# Patient Record
Sex: Male | Born: 1980 | Race: White | Hispanic: No | Marital: Single | Smoking: Current every day smoker
Health system: Southern US, Community
[De-identification: ages and names within clinical notes are randomized; demographics above are authoritative.]

## PROBLEM LIST (undated history)

## (undated) DIAGNOSIS — H9190 Unspecified hearing loss, unspecified ear: Secondary | ICD-10-CM

## (undated) DIAGNOSIS — N289 Disorder of kidney and ureter, unspecified: Secondary | ICD-10-CM

## (undated) HISTORY — PX: TONSILLECTOMY: SUR1361

## (undated) HISTORY — PX: NASAL SINUS SURGERY: SHX719

## (undated) HISTORY — PX: ADENOIDECTOMY: SUR15

---

## 2013-10-06 ENCOUNTER — Emergency Department: Payer: Self-pay | Admitting: Emergency Medicine

## 2013-11-28 DIAGNOSIS — Y9289 Other specified places as the place of occurrence of the external cause: Secondary | ICD-10-CM | POA: Insufficient documentation

## 2013-11-28 DIAGNOSIS — S8000XA Contusion of unspecified knee, initial encounter: Secondary | ICD-10-CM | POA: Insufficient documentation

## 2013-11-28 DIAGNOSIS — S99929A Unspecified injury of unspecified foot, initial encounter: Secondary | ICD-10-CM

## 2013-11-28 DIAGNOSIS — W108XXA Fall (on) (from) other stairs and steps, initial encounter: Secondary | ICD-10-CM | POA: Insufficient documentation

## 2013-11-28 DIAGNOSIS — Y9389 Activity, other specified: Secondary | ICD-10-CM | POA: Diagnosis not present

## 2013-11-28 DIAGNOSIS — S8990XA Unspecified injury of unspecified lower leg, initial encounter: Secondary | ICD-10-CM | POA: Diagnosis present

## 2013-11-28 DIAGNOSIS — Z87891 Personal history of nicotine dependence: Secondary | ICD-10-CM | POA: Diagnosis not present

## 2013-11-28 DIAGNOSIS — S99919A Unspecified injury of unspecified ankle, initial encounter: Secondary | ICD-10-CM

## 2013-11-28 NOTE — ED Notes (Signed)
Pt states he was going up stairs and he fell landing on his right knee. Pt states he felt something snap. Pt states tried Ibuprofen with no relief.

## 2013-11-29 ENCOUNTER — Emergency Department (HOSPITAL_COMMUNITY): Payer: Medicaid Other

## 2013-11-29 ENCOUNTER — Encounter (HOSPITAL_COMMUNITY): Payer: Self-pay | Admitting: Emergency Medicine

## 2013-11-29 ENCOUNTER — Emergency Department (HOSPITAL_COMMUNITY)
Admission: EM | Admit: 2013-11-29 | Discharge: 2013-11-29 | Disposition: A | Discharge status: Home / Self Care | Payer: Medicaid Other | Attending: Emergency Medicine | Admitting: Emergency Medicine

## 2013-11-29 DIAGNOSIS — S8001XA Contusion of right knee, initial encounter: Secondary | ICD-10-CM

## 2013-11-29 MED ORDER — HYDROCODONE-ACETAMINOPHEN 5-325 MG PO TABS
2.0000 | ORAL_TABLET | ORAL | Status: AC | PRN
Start: 2013-11-29 — End: ?

## 2013-11-29 MED ORDER — KETOROLAC TROMETHAMINE 60 MG/2ML IM SOLN
60.0000 mg | Freq: Once | INTRAMUSCULAR | Status: AC
  Administered 2013-11-29: 60 mg via INTRAMUSCULAR
  Filled 2013-11-29: qty 2

## 2013-11-29 MED ORDER — NAPROXEN 500 MG PO TABS: 500.0000 mg | ORAL_TABLET | Freq: Two times a day (BID) | ORAL | Status: DC

## 2013-11-29 NOTE — ED Provider Notes (Signed)
CSN: [id number]     Arrival date & time 11/28/13  2356 History   First MD Initiated Contact with Patient 11/29/13 0148     Chief Complaint  Patient presents with  . Fall  . Knee Pain     (Consider location/radiation/quality/duration/timing/severity/associated sxs/prior Treatment) HPI Comments: 33 year old male, no significant past medical history presents after a fall where he injured his right knee, this occurred just prior to arrival, fell on his right knee with a direct impact over the patella. There persisted, worse with ambulation and associated with mild swelling anteriorly. He denies history of fractures.  Patient is a 33 y.o. male presenting with fall and knee pain. The history is provided by the patient.  Fall  Knee Pain   History reviewed. No pertinent past medical history. Past Surgical History  Procedure Laterality Date  . Tonsillectomy     History reviewed. No pertinent family history. History  Substance Use Topics  . Smoking status: Former Research scientist (life sciences)  . Smokeless tobacco: Not on file  . Alcohol Use: No    Review of Systems  Musculoskeletal: Positive for gait problem.  Skin: Positive for color change.      Allergies  Levofloxacin and Tramadol  Home Medications   Prior to Admission medications   Medication Sig Start Date End Date Taking? Authorizing Provider  HYDROcodone-acetaminophen (NORCO/VICODIN) 5-325 MG per tablet Take 2 tablets by mouth every 4 (four) hours as needed. 11/29/13   Johnna Acosta, MD  naproxen (NAPROSYN) 500 MG tablet Take 1 tablet (500 mg total) by mouth 2 (two) times daily with a meal. 11/29/13   Johnna Acosta, MD   BP 144/83  Pulse 92  Temp(Src) 97.7 F (36.5 C) (Oral)  Resp 18  SpO2 99% Physical Exam  Nursing note and vitals reviewed. Constitutional: He appears well-developed and well-nourished. No distress.  HENT:  Head: Normocephalic and atraumatic.  Eyes: Conjunctivae are normal. No scleral icterus.  Cardiovascular: Normal  rate, regular rhythm and intact distal pulses.   Pulmonary/Chest: Effort normal and breath sounds normal.  Musculoskeletal: He exhibits tenderness ( Tenderness in the distal quadriceps, patellar tendon and mostly over the patella with associated small hematoma. He is able to straight leg raise. Decreased range of motion secondary to pain). He exhibits no edema.  Neurological: He is alert.  Skin: Skin is warm and dry. No rash noted. He is not diaphoretic.    ED Course  Procedures (including critical care time) Labs Review Labs Reviewed - No data to display  Imaging Review Dg Knee Complete 4 Views Right  11/29/2013   CLINICAL DATA:  Fall.  EXAM: RIGHT KNEE - COMPLETE 4+ VIEW  COMPARISON:  10/06/2013 A  FINDINGS: There is no evidence of fracture, dislocation, or joint effusion. There is no evidence of arthropathy or other focal bone abnormality. Soft tissues are unremarkable.  IMPRESSION: Negative.   Electronically Signed   By: Franchot Gallo M.D.   On: 11/29/2013 01:14      MDM   Final diagnoses:  Contusion of right knee, initial encounter    Extensor mechanism is intact, x-rays negative for fracture, hematoma, rice therapy, patient informed of plan and agreeable   Meds given in ED:  Medications  ketorolac (TORADOL) injection 60 mg (not administered)    New Prescriptions   HYDROCODONE-ACETAMINOPHEN (NORCO/VICODIN) 5-325 MG PER TABLET    Take 2 tablets by mouth every 4 (four) hours as needed.   NAPROXEN (NAPROSYN) 500 MG TABLET    Take 1 tablet (500  mg total) by mouth 2 (two) times daily with a meal.        Johnna Acosta, MD 11/29/13 0200

## 2013-11-29 NOTE — ED Notes (Signed)
Discharge instructions reviewed with pt. Pt verbalized understanding.   

## 2013-11-29 NOTE — ED Notes (Signed)
Pt was carrying a load of laundry up the stairs when he fell backwards and hit his knee on the concrete. Pt states he felt a pop. Pt took ibuprofen 800mg  at 10pm and 12am with no relief. Pt rates pain 7-8/10. Redness and swelling noted to knee. Pt states his knee pops every time he bends it.

## 2013-11-29 NOTE — Discharge Instructions (Signed)
Xray normal, follow up with family doctor Wear the knee immobilizer for 2-3 days  RICE therapy  Emergency Department Resource Guide 1) Find a Doctor and Pay Out of Pocket Although you won't have to find out who is covered by your insurance plan, it is a good idea to ask around and get recommendations. You will then need to call the office and see if the doctor you have chosen will accept you as a new patient and what types of options they offer for patients who are self-pay. Some doctors offer discounts or will set up payment plans for their patients who do not have insurance, but you will need to ask so you aren't surprised when you get to your appointment.  2) Contact Your Local Health Department Not all health departments have doctors that can see patients for sick visits, but many do, so it is worth a call to see if yours does. If you don't know where your local health department is, you can check in your phone book. The CDC also has a tool to help you locate your state's health department, and many state websites also have listings of all of their local health departments.  3) Find a [facility] If your illness is not likely to be very severe or complicated, you may want to try a walk in clinic. These are popping up all over the country in pharmacies, drugstores, and shopping centers. They're usually staffed by nurse practitioners or physician assistants that have been trained to treat common illnesses and complaints. They're usually fairly quick and inexpensive. However, if you have serious medical issues or chronic medical problems, these are probably not your best option.  No Primary Care Doctor: - Call Health Connect at  [phone] - they can help you locate a primary care doctor that  accepts your insurance, provides certain services, etc. - Physician Referral Service- [phone]  Chronic Pain Problems: Organization         Address  Phone   Notes  [facility]   [phone] Patients need to be referred by their primary care doctor.   Medication Assistance: Organization         Address  Phone   Notes  [facility] Medication [facility] [address] [place], [place] [address] [phone] --Must be a resident of [facility] -- Must have NO insurance coverage whatsoever (no Medicaid/ Medicare, etc.) -- The pt. MUST have a primary care doctor that directs their care regularly and follows them in the community   MedAssist  [phone]   [employer]  [phone]    Agencies that provide inexpensive medical care: Organization         Address  Phone   Notes  [place]  [phone]   [facility]    [phone]   [facility] [place], [place] [address] [phone]   [place] [address], [place] [phone]   Planned Parenthood    7177204785   [facility]    [phone]   [place] and [place] [address], [place] Phone:  [phone], Fax:  [phone] Hours of Operation:  9 am - 6 pm, M-F.  Also accepts Medicaid/Medicare and self-pay.  [facility] for [place] [address], [address], [place] Phone: [phone], Fax: [phone]. Hours of Operation:  8:30 am - 5:30 pm, M-F.  Also accepts Medicaid and self-pay.  [facility] [place] [address] [place] Phone: [phone]   [place], [place], [place] [phone], Ext. [phone] Mondays & Thursdays: 7-9 AM.  First 15 patients are seen on a first come, first serve basis.    [place] Providers:  Organization         Address  Phone   Notes  [facility] [address], [place] [phone] Also accepts self-pay patients.  [facility] [address] [place], [place]  [phone]   [place], [address], [place] [phone]   [facility] [address], [place] [phone]   Lucianne Lei [address], [place]   [phone] Only accepts [place] Access [place] patients after they have their name applied to their card.   Self-Pay (no insurance) in [facility]:  Organization         Address  Phone   Notes  Sickle Cell Patients, [facility] [place] [phone]   [facility]gent Care [place] [phone]   [facility] [place]  [place], [place], [employer] [phone]   [facility]/Dr. Osei-Bonsu  [address] [place] or [address], [place] [phone] Phone number for both [place] and [place] locations is the same.  Urgent Medical and [facility] [address], [place] [phone]   [facility] [address] [place] or [address] [phone]   [facility] [address] [place] [phone], phone; [phone], fax Sees patients 1st and 3rd Saturday of every month.  Must not qualify for public or private insurance (i.e. Medicaid, Medicare, [place] Health Choice, Veterans' Benefits)  Household income should be no more than 200% of the poverty level The clinic cannot treat you if you are pregnant or think you are pregnant  Sexually transmitted diseases are not treated at the clinic.    Dental Care: Organization         Address  Phone  Notes  [facility] Department of [facility] [place] [phone] Accepts children up to age 10 who are enrolled in [place] or [place]; pregnant women with a Medicaid card; and children who have applied for Medicaid or [place] Health  Choice, but were declined, whose parents can pay a reduced fee at time of service.  [facility] Department of [facility]  [address], [place] [phone] Accepts children up to age 32 who are enrolled in [place] or [place]; pregnant women with a Medicaid card; and children who have applied for Medicaid or [place] Health Choice, but were declined, whose parents can pay a reduced fee at time of service.  [place] Adult Dental Access PROGRAM  [place] [phone] Patients are seen by appointment only. Walk-ins are not accepted. [place] will see patients 78 years of age and older. Monday - Tuesday (8am-5pm) Most Wednesdays (8:30-5pm) $30 per visit, cash only  [facility] Adult Dental Access PROGRAM  [address], [facility] [phone] Patients are seen by appointment  only. Walk-ins are not accepted. [place] will see patients 69 years of age and older. One Wednesday Evening (Monthly: Volunteer Based).  $30 per visit, cash only  [place]  [phone] for adults; Children under age 5, call Graduate Pediatric Dentistry at [phone]. Children aged 58-14, please call [phone] to request a pediatric application.  Dental services are provided in all areas of dental care including fillings, crowns and bridges, complete and partial dentures, implants, gum treatment, root canals, and extractions. Preventive care is also provided. Treatment is provided to both adults and children. Patients are selected via a lottery and there is often a waiting list.   [facility] [address], [place]  [phone] [email or web address]   [facility] [address] [place], [place] [phone], Ext. [phone] Second and Fourth Thursday of each month, opens at 6:30 AM; Clinic ends at 9 AM.  Patients are seen on a first-come first-served basis, and a limited number are seen during each  clinic.   [facility]  [address] Hillard Danker [place], [place] [phone]   Eligibility Requirements You must have lived in [place], [place], or [place] counties for at least the last three months.   You cannot be eligible for state or federal sponsored [employer], including [employer], [place], or [employer].   You generally cannot be eligible for healthcare insurance through your employer.    How to apply: Eligibility screenings are held every Tuesday and Wednesday afternoon from 1:00 pm until 4:00 pm. You do not need an appointment for the interview!  [facility] [address] [place], [place]   [place]  [place] Department  [place]  [phone]    Behavioral Health Resources in the Community: Intensive Outpatient Programs Organization         Address  Phone  Notes  [place]. [address] [place], [place] [phone]   [facility] Outpatient [address], [place], [place]   ADS: Alcohol & Drug Svcs [address] [place], [place]   [place] [address]  [place], [place] or [phone]   Substance Abuse Resources Organization         Address  Phone  Notes  Alcohol and Drug Services  [phone]   [place]  8025057661   [facility] [place]   Chinita Pester  [phone]   Residential & Outpatient Substance Abuse Program  [phone]   Psychological Services Organization         Address  Phone  Notes  [facility] [place]  [place]  [phone]   [place] [address] [place] or [phone]    Mobile Crisis Teams Organization         Address  Phone  Notes  [facility]rapeutic Alternatives, Mobile  Crisis Care Unit  [phone]   Assertive Psychotherapeutic Services  [address] [place], [place]   [place] [address] [place] [phone]    Self-Help/Support Groups Organization         Address  Phone             Notes  [place]. of  - variety of support groups  [place] Call for more information  Narcotics Anonymous (NA), [facility]aring [facility] [address], [employer] [place]  2 meetings at this location   Special educational needs teacher         Address  Phone  Notes  [facility]AP Residential Treatment [place],    [place]  [phone]   [facility]  [address] [place] [address], [place], [place]   [place], [place] [phone] Admissions: 8am-3pm M-F  Incentives Substance [place] [address]    [place], [place] [phone]   [facility] [address] [place], [place], [place]   [facility] [address],  [place], [place]   [facility] Programs - Intensive Outpatient [address], [place], [place]   [facility] ([place].,  [place], [place] [phone] or [phone]   [facility] Services (RTS) [address] [place], [place] Accepts Medicaid  [place] [address],  [place] [phone] Substance Abuse/Addiction Treatment   [facility] Organization         Address  Phone  Notes  [facility]  [phone]   Domenic Schwab, PhD [address] Arlis Porta [place], [place]   [phone] or [phone]   [place], [place] [phone]   [facility] [address], [place], [place] [phone] Insurance/Medicaid/sponsorship through [facility] and Families [address], [address]                                    [place], [place] [phone] [place] [address], [place] [phone]    Dr. Adele Schilder  [phone]   [facility] Dept. 1) [address], [place] 2) [place] 3)  [place] [address], [place] [phone]  [phone]   [place] [phone] or [phone] (After Hours)

## 2013-12-18 ENCOUNTER — Encounter (HOSPITAL_COMMUNITY): Payer: Self-pay | Admitting: Emergency Medicine

## 2013-12-18 ENCOUNTER — Emergency Department (HOSPITAL_COMMUNITY)
Admission: EM | Admit: 2013-12-18 | Discharge: 2013-12-18 | Disposition: A | Discharge status: Home / Self Care | Payer: Medicaid Other | Attending: Family Medicine | Admitting: Family Medicine

## 2013-12-18 DIAGNOSIS — Z791 Long term (current) use of non-steroidal anti-inflammatories (NSAID): Secondary | ICD-10-CM | POA: Diagnosis not present

## 2013-12-18 DIAGNOSIS — H7290 Unspecified perforation of tympanic membrane, unspecified ear: Secondary | ICD-10-CM | POA: Diagnosis not present

## 2013-12-18 DIAGNOSIS — H9210 Otorrhea, unspecified ear: Secondary | ICD-10-CM | POA: Diagnosis present

## 2013-12-18 DIAGNOSIS — Z87891 Personal history of nicotine dependence: Secondary | ICD-10-CM | POA: Insufficient documentation

## 2013-12-18 DIAGNOSIS — H7291 Unspecified perforation of tympanic membrane, right ear: Secondary | ICD-10-CM

## 2013-12-18 DIAGNOSIS — H9190 Unspecified hearing loss, unspecified ear: Secondary | ICD-10-CM | POA: Diagnosis not present

## 2013-12-18 HISTORY — DX: Unspecified hearing loss, unspecified ear: H91.90

## 2013-12-18 MED ORDER — CIPROFLOXACIN-HYDROCORTISONE 0.2-1 % OT SUSP: 4.0000 [drp] | Freq: Two times a day (BID) | OTIC | Status: AC

## 2013-12-18 MED ORDER — AMOXICILLIN-POT CLAVULANATE 875-125 MG PO TABS: 1.0000 | ORAL_TABLET | Freq: Two times a day (BID) | ORAL | Status: DC

## 2013-12-18 NOTE — ED Notes (Signed)
Declined W/C at D/C and was escorted to lobby by RN. 

## 2013-12-18 NOTE — ED Provider Notes (Signed)
CSN: [id number]     Arrival date & time 12/18/13  1400 History   First MD Initiated Contact with Patient 12/18/13 1420     Chief Complaint  Patient presents with  . Ear Drainage     (Consider location/radiation/quality/duration/timing/severity/associated sxs/prior Treatment) Patient is a 33 y.o. male presenting with ear drainage. The history is provided by the patient.  Ear Drainage This is a new problem. The current episode started more than 2 days ago (onset of pain and drainage of right ear 4 d ago after swimming,h/o tubes in ears.). The problem has not changed since onset.Associated symptoms comments: C/o pain..    Past Medical History  Diagnosis Date  . Problems with hearing    Past Surgical History  Procedure Laterality Date  . Tonsillectomy     No family history on file. History  Substance Use Topics  . Smoking status: Former Research scientist (life sciences)  . Smokeless tobacco: Not on file  . Alcohol Use: No    Review of Systems  Constitutional: Negative.   HENT: Positive for ear discharge, ear pain and hearing loss.       Allergies  Levofloxacin and Tramadol  Home Medications   Prior to Admission medications   Medication Sig Start Date End Date Taking? Authorizing Provider  HYDROcodone-acetaminophen (NORCO/VICODIN) 5-325 MG per tablet Take 2 tablets by mouth every 4 (four) hours as needed. 11/29/13  Yes Johnna Acosta, MD  naproxen (NAPROSYN) 500 MG tablet Take 1 tablet (500 mg total) by mouth 2 (two) times daily with a meal. 11/29/13  Yes Johnna Acosta, MD  amoxicillin-clavulanate (AUGMENTIN) 875-125 MG per tablet Take 1 tablet by mouth 2 (two) times daily. 12/18/13   Billy Fischer, MD  ciprofloxacin-hydrocortisone (CIPRO [facility]) otic suspension Place 4 drops into both ears 2 (two) times daily. 12/18/13   Billy Fischer, MD   BP 136/92  Pulse 76  Temp(Src) 97.9 F (36.6 C) (Oral)  Resp 15  Wt 200 lb (90.719 kg)  SpO2 99% Physical Exam  Nursing note and vitals  reviewed. Constitutional: He appears well-developed and well-nourished.  HENT:  Head: Normocephalic.  Right Ear: Ear canal normal. Tympanic membrane is scarred and retracted. Tympanic membrane is not erythematous. Tympanic membrane mobility is abnormal.  Left Ear: Ear canal normal. Tympanic membrane is not erythematous. Tympanic membrane mobility is normal.  Ears:  Mouth/Throat: Oropharynx is clear and moist.    ED Course  Procedures (including critical care time) Labs Review Labs Reviewed - No data to display  Imaging Review No results found.   EKG Interpretation None      MDM   Final diagnoses:  Perforated tympanic membrane on examination, right       Billy Fischer, MD 12/18/13 [phone]

## 2013-12-18 NOTE — Discharge Instructions (Signed)
Use medicine as prescribed and see dr Erik Obey for ear check this week.

## 2013-12-18 NOTE — ED Notes (Signed)
Pt reports went swimming days ago, felt a pop in his ears, since then right ear drainage. Reports he has tubes in his ears for his whole life. Reports hearing is decreased

## 2015-05-02 ENCOUNTER — Emergency Department
Admission: EM | Admit: 2015-05-02 | Discharge: 2015-05-02 | Disposition: A | Discharge status: Home / Self Care | Payer: Medicaid Other | Attending: Emergency Medicine | Admitting: Emergency Medicine

## 2015-05-02 ENCOUNTER — Encounter: Payer: Self-pay | Admitting: Emergency Medicine

## 2015-05-02 DIAGNOSIS — Z87891 Personal history of nicotine dependence: Secondary | ICD-10-CM | POA: Insufficient documentation

## 2015-05-02 DIAGNOSIS — Z792 Long term (current) use of antibiotics: Secondary | ICD-10-CM | POA: Insufficient documentation

## 2015-05-02 DIAGNOSIS — Z791 Long term (current) use of non-steroidal anti-inflammatories (NSAID): Secondary | ICD-10-CM | POA: Insufficient documentation

## 2015-05-02 DIAGNOSIS — L089 Local infection of the skin and subcutaneous tissue, unspecified: Secondary | ICD-10-CM

## 2015-05-02 DIAGNOSIS — L723 Sebaceous cyst: Secondary | ICD-10-CM | POA: Insufficient documentation

## 2015-05-02 MED ORDER — IBUPROFEN 800 MG PO TABS: 800.0000 mg | ORAL_TABLET | Freq: Three times a day (TID) | ORAL | Status: DC | PRN

## 2015-05-02 MED ORDER — LIDOCAINE-EPINEPHRINE 1 %-1:100000 IJ SOLN
30.0000 mL | Freq: Once | INTRAMUSCULAR | Status: DC
  Filled 2015-05-02: qty 30

## 2015-05-02 MED ORDER — SULFAMETHOXAZOLE-TRIMETHOPRIM 800-160 MG PO TABS: 1.0000 | ORAL_TABLET | Freq: Two times a day (BID) | ORAL | Status: DC

## 2015-05-02 MED ORDER — OXYCODONE-ACETAMINOPHEN 5-325 MG PO TABS: 1.0000 | ORAL_TABLET | Freq: Four times a day (QID) | ORAL | Status: DC | PRN

## 2015-05-02 MED ORDER — LIDOCAINE HCL (PF) 1 % IJ SOLN
5.0000 mL | Freq: Once | INTRAMUSCULAR | Status: AC
  Administered 2015-05-02: 5 mL
  Filled 2015-05-02: qty 5

## 2015-05-02 MED ORDER — SULFAMETHOXAZOLE-TRIMETHOPRIM 800-160 MG PO TABS
1.0000 | ORAL_TABLET | Freq: Once | ORAL | Status: AC
  Administered 2015-05-02: 1 via ORAL
  Filled 2015-05-02: qty 1

## 2015-05-02 MED ORDER — OXYCODONE-ACETAMINOPHEN 5-325 MG PO TABS
1.0000 | ORAL_TABLET | Freq: Once | ORAL | Status: AC
  Administered 2015-05-02: 1 via ORAL
  Filled 2015-05-02: qty 1

## 2015-05-02 NOTE — ED Notes (Addendum)
Red raised area with black center to left upper abdomen. Pt reports drainage x 2 days.

## 2015-05-02 NOTE — ED Notes (Signed)
Pt discharged to home.  Friend driving.  Discharge instructions reviewed.  Verbalized understanding.  No questions or concerns at this time.  Teach back verified.  Pt in NAD.  No items left in ED.   

## 2015-05-02 NOTE — ED Provider Notes (Signed)
[facility] Emergency Department Provider Note  ____________________________________________  Time seen: Approximately 9:48 PM  I have reviewed the triage vital signs and the nursing notes.   HISTORY  Chief Complaint Abscess    HPI Eric Carrillo is a 35 y.o. male with approximately 2 week history of increasing soreness, redness to the left anterior chest. Area started to drain yesterday. There is become more sore. No fevers or chills.   Past Medical History  Diagnosis Date  . Problems with hearing     There are no active problems to display for this patient.   Past Surgical History  Procedure Laterality Date  . Tonsillectomy    . Adenoidectomy    . Nasal sinus surgery      Current Outpatient Rx  Name  Route  Sig  Dispense  Refill  . amoxicillin-clavulanate (AUGMENTIN) 875-125 MG per tablet   Oral   Take 1 tablet by mouth 2 (two) times daily.   20 tablet   0   . ciprofloxacin-hydrocortisone (CIPRO HC) otic suspension   Both Ears   Place 4 drops into both ears 2 (two) times daily.   10 mL   0   . HYDROcodone-acetaminophen (NORCO/VICODIN) 5-325 MG per tablet   Oral   Take 2 tablets by mouth every 4 (four) hours as needed.   10 tablet   0   . ibuprofen (ADVIL,MOTRIN) 800 MG tablet   Oral   Take 1 tablet (800 mg total) by mouth every 8 (eight) hours as needed.   15 tablet   0   . naproxen (NAPROSYN) 500 MG tablet   Oral   Take 1 tablet (500 mg total) by mouth 2 (two) times daily with a meal.   30 tablet   0   . oxyCODONE-acetaminophen (ROXICET) 5-325 MG tablet   Oral   Take 1 tablet by mouth every 6 (six) hours as needed.   20 tablet   0   . sulfamethoxazole-trimethoprim (BACTRIM DS,SEPTRA DS) 800-160 MG tablet   Oral   Take 1 tablet by mouth 2 (two) times daily.   14 tablet   0     Allergies Levofloxacin and Tramadol  History reviewed. No pertinent family history.  Social History Social History  Substance Use  Topics  . Smoking status: Former Research scientist (life sciences)  . Smokeless tobacco: None  . Alcohol Use: No    Review of Systems Constitutional: No fever/chills Eyes: No visual changes. ENT: No sore throat. Cardiovascular: Denies chest pain. Respiratory: Denies shortness of breath. Gastrointestinal: No abdominal pain.  No nausea, no vomiting.  No diarrhea.  No constipation. Genitourinary: Negative for dysuria. Musculoskeletal: Negative for back pain. Skin: Negative for rash, but has infected sebaceous cyst to left chest wall. Neurological: Negative for headaches, focal weakness or numbness. 10-point ROS otherwise negative.  ____________________________________________   PHYSICAL EXAM:  VITAL SIGNS: ED Triage Vitals  Enc Vitals Group     BP 05/02/15 2120 152/93 mmHg     Pulse Rate 05/02/15 2120 90     Resp 05/02/15 2120 18     Temp 05/02/15 2120 98.1 F (36.7 C)     Temp Source 05/02/15 2120 Oral     SpO2 05/02/15 2120 99 %     Weight 05/02/15 2120 215 lb (97.523 kg)     Height 05/02/15 2120 5\' 8"  (1.727 m)     Head Cir --      Peak Flow --      Pain Score 05/02/15 2121  8     Pain Loc --      Pain Edu? --      Excl. in [place]? --     Constitutional: Alert and oriented. Well appearing and in no acute distress. Eyes: Conjunctivae are normal.  Mouth/Throat: Mucous membranes are moist. Neck:  Supple.   Cardiovascular: Normal rate, regular rhythm. Grossly normal heart sounds.  Good peripheral circulation. Respiratory: Normal respiratory effort.  No retractions. Lungs CTAB. Musculoskeletal: Nml ROM of upper and lower extremity joints. Neurologic:  Normal speech and language. No gross focal neurologic deficits are appreciated. No gait instability. Skin:  Skin is warm, dry and intact. No rash noted. 3 cm indurated area to the left chest wall with surrounding erythema small amount of purulent drainage noted. Psychiatric: Mood and affect are normal. Speech and behavior are  normal.  ____________________________________________   LABS (all labs ordered are listed, but only abnormal results are displayed)  Labs Reviewed - No data to display ____________________________________________  EKG   ____________________________________________  RADIOLOGY   ____________________________________________   PROCEDURES  Procedure(s) performed: Area cleansed with chlorhexidine. Anesthesia with lidocaine 1%. Incision with 11 blade. Small amount of purulent drainage, cyst material removed. Packing gauze used. Patient instructed in wound care.  Critical Care performed: No  ____________________________________________   INITIAL IMPRESSION / ASSESSMENT AND PLAN / ED COURSE  Pertinent labs & imaging results that were available during my care of the patient were reviewed by me and considered in my medical decision making (see chart for details).  35 year old male with infected cyst to the left chest wall. Incision and drainage performed as per procedure above. Started on Bactrim and given Percocet for pain control with ibuprofen. Given handouts on possible MRSA. He will return for any worsening symptoms. He anticipates follow-up with dermatology for excision of cyst. ____________________________________________   FINAL CLINICAL IMPRESSION(S) / ED DIAGNOSES  Final diagnoses:  Infected sebaceous cyst      Mortimer Fries, PA-C 05/02/15 2239  Schuyler Amor, MD 05/02/15 2300

## 2015-05-02 NOTE — Discharge Instructions (Signed)
Epidermal Cyst An epidermal cyst is usually a small, painless lump under the skin. Cysts often occur on the face, neck, stomach, chest, or genitals. The cyst may be filled with a bad smelling paste. Do not pop your cyst. Popping the cyst can cause pain and puffiness (swelling). HOME CARE   Only take medicines as told by your doctor.  Take your medicine (antibiotics) as told. Finish it even if you start to feel better. GET HELP RIGHT AWAY IF:  Your cyst is tender, red, or puffy.  You are not getting better, or you are getting worse.  You have any questions or concerns. MAKE SURE YOU:  Understand these instructions.  Will watch your condition.  Will get help right away if you are not doing well or get worse.   This information is not intended to replace advice given to you by your health care provider. Make sure you discuss any questions you have with your health care provider.   Document Released: 05/21/2004 Document Revised: 10/13/2011 Document Reviewed: 10/20/2010 Elsevier Interactive Patient Education 2016 [employer]   Take Antibiotics and pain medicine as directed. He may remove the gauze strip and 3-4 days. Return to the emergency room for increasing pain, redness. This cyst could be infection from MRSA.  Follow-up with your provider for further excision of the cyst.

## 2015-05-02 NOTE — ED Notes (Signed)
Pt c/o abscess to left chest. Has been present X 1 month. Unable to get into clinic until February.  Started draining today

## 2017-11-13 ENCOUNTER — Encounter: Payer: Self-pay | Admitting: Emergency Medicine

## 2017-11-13 ENCOUNTER — Other Ambulatory Visit: Payer: Self-pay

## 2017-11-13 ENCOUNTER — Emergency Department: Payer: Self-pay

## 2017-11-13 ENCOUNTER — Emergency Department
Admission: EM | Admit: 2017-11-13 | Discharge: 2017-11-13 | Disposition: A | Discharge status: Home / Self Care | Payer: Self-pay | Attending: Emergency Medicine | Admitting: Emergency Medicine

## 2017-11-13 DIAGNOSIS — N23 Unspecified renal colic: Secondary | ICD-10-CM | POA: Insufficient documentation

## 2017-11-13 DIAGNOSIS — Z87442 Personal history of urinary calculi: Secondary | ICD-10-CM | POA: Insufficient documentation

## 2017-11-13 DIAGNOSIS — Z87891 Personal history of nicotine dependence: Secondary | ICD-10-CM | POA: Insufficient documentation

## 2017-11-13 DIAGNOSIS — Z79899 Other long term (current) drug therapy: Secondary | ICD-10-CM | POA: Insufficient documentation

## 2017-11-13 HISTORY — DX: Disorder of kidney and ureter, unspecified: N28.9

## 2017-11-13 LAB — URINALYSIS, COMPLETE (UACMP) WITH MICROSCOPIC
BILIRUBIN URINE: NEGATIVE
Bacteria, UA: NONE SEEN
Glucose, UA: NEGATIVE mg/dL
KETONES UR: NEGATIVE mg/dL
Leukocytes, UA: NEGATIVE
NITRITE: NEGATIVE
PROTEIN: NEGATIVE mg/dL
Specific Gravity, Urine: 1.005 (ref 1.005–1.030)
pH: 6 (ref 5.0–8.0)

## 2017-11-13 LAB — CBC
HCT: 45.4 % (ref 40.0–52.0)
Hemoglobin: 15.7 g/dL (ref 13.0–18.0)
MCH: 31.3 pg (ref 26.0–34.0)
MCHC: 34.6 g/dL (ref 32.0–36.0)
MCV: 90.5 fL (ref 80.0–100.0)
PLATELETS: 300 10*3/uL (ref 150–440)
RBC: 5.02 MIL/uL (ref 4.40–5.90)
RDW: 13.6 % (ref 11.5–14.5)
WBC: 15.1 10*3/uL — AB (ref 3.8–10.6)

## 2017-11-13 LAB — BASIC METABOLIC PANEL
Anion gap: 10 (ref 5–15)
BUN: 17 mg/dL (ref 6–20)
CALCIUM: 9.5 mg/dL (ref 8.9–10.3)
CO2: 25 mmol/L (ref 22–32)
CREATININE: 0.95 mg/dL (ref 0.61–1.24)
Chloride: 103 mmol/L (ref 98–111)
Glucose, Bld: 94 mg/dL (ref 70–99)
Potassium: 3.7 mmol/L (ref 3.5–5.1)
SODIUM: 138 mmol/L (ref 135–145)

## 2017-11-13 MED ORDER — MORPHINE SULFATE (PF) 4 MG/ML IV SOLN
4.0000 mg | Freq: Once | INTRAVENOUS | Status: AC
  Administered 2017-11-13: 4 mg via INTRAVENOUS
  Filled 2017-11-13: qty 1

## 2017-11-13 MED ORDER — SODIUM CHLORIDE 0.9 % IV BOLUS
1000.0000 mL | Freq: Once | INTRAVENOUS | Status: AC
  Administered 2017-11-13: 1000 mL via INTRAVENOUS

## 2017-11-13 MED ORDER — IBUPROFEN 600 MG PO TABS: 600.0000 mg | ORAL_TABLET | Freq: Four times a day (QID) | ORAL | 0 refills | Status: AC | PRN

## 2017-11-13 MED ORDER — KETOROLAC TROMETHAMINE 30 MG/ML IJ SOLN
30.0000 mg | Freq: Once | INTRAMUSCULAR | Status: AC
  Administered 2017-11-13: 30 mg via INTRAVENOUS
  Filled 2017-11-13: qty 1

## 2017-11-13 MED ORDER — TAMSULOSIN HCL 0.4 MG PO CAPS: 0.4000 mg | ORAL_CAPSULE | Freq: Every day | ORAL | 0 refills | Status: AC

## 2017-11-13 MED ORDER — OXYCODONE-ACETAMINOPHEN 5-325 MG PO TABS: 1.0000 | ORAL_TABLET | Freq: Four times a day (QID) | ORAL | 0 refills | Status: AC | PRN

## 2017-11-13 MED ORDER — ONDANSETRON HCL 4 MG/2ML IJ SOLN
4.0000 mg | Freq: Once | INTRAMUSCULAR | Status: AC
  Administered 2017-11-13: 4 mg via INTRAVENOUS
  Filled 2017-11-13: qty 2

## 2017-11-13 NOTE — ED Triage Notes (Signed)
Pt arrives ambulatory to triage with c/o right flank pain. Pt reports hx of kidney stones. Pt is in visible discomfort but in NAD.

## 2017-11-13 NOTE — ED Provider Notes (Signed)
[facility] Emergency Department Provider Note ____________________________________________   First MD Initiated Contact with Patient 11/13/17 2015     (approximate)  I have reviewed the triage vital signs and the nursing notes.   HISTORY  Chief Complaint Flank Pain    HPI Eric Carrillo is a 37 y.o. male with PMH as noted below including prior history of kidney stones (most recently about 3 years ago) who presents with right flank pain, acute onset 2 days ago, worsening today, and associated with nausea but no vomiting.  Patient states the pain radiates somewhat to his right groin.  He denies fevers or any urinary symptoms.  He states it feels similar to his prior kidney stone.   Past Medical History:  Diagnosis Date  . Problems with hearing   . Renal disorder     There are no active problems to display for this patient.   Past Surgical History:  Procedure Laterality Date  . ADENOIDECTOMY    . NASAL SINUS SURGERY    . TONSILLECTOMY      Prior to Admission medications   Medication Sig Start Date End Date Taking? Authorizing Provider  amoxicillin-clavulanate (AUGMENTIN) 875-125 MG per tablet Take 1 tablet by mouth 2 (two) times daily. 12/18/13   Billy Fischer, MD  ciprofloxacin-hydrocortisone (CIPRO [facility]) otic suspension Place 4 drops into both ears 2 (two) times daily. 12/18/13   Billy Fischer, MD  HYDROcodone-acetaminophen (NORCO/VICODIN) 5-325 MG per tablet Take 2 tablets by mouth every 4 (four) hours as needed. 11/29/13   Noemi Chapel, MD  ibuprofen (ADVIL,MOTRIN) 600 MG tablet Take 1 tablet (600 mg total) by mouth every 6 (six) hours as needed for moderate pain. 11/13/17   Arta Silence, MD  naproxen (NAPROSYN) 500 MG tablet Take 1 tablet (500 mg total) by mouth 2 (two) times daily with a meal. 11/29/13   Noemi Chapel, MD  oxyCODONE-acetaminophen (PERCOCET) 5-325 MG tablet Take 1 tablet by mouth every 6 (six) hours as needed for up to 5 days  for severe pain. 11/13/17 11/18/17  Arta Silence, MD  sulfamethoxazole-trimethoprim (BACTRIM DS,SEPTRA DS) 800-160 MG tablet Take 1 tablet by mouth 2 (two) times daily. 05/02/15   Mortimer Fries, PA-C  tamsulosin (FLOMAX) 0.4 MG CAPS capsule Take 1 capsule (0.4 mg total) by mouth daily after supper for 10 days. 11/13/17 11/23/17  Arta Silence, MD    Allergies Levofloxacin and Tramadol  No family history on file.  Social History Social History   Tobacco Use  . Smoking status: Former Research scientist (life sciences)  . Smokeless tobacco: Never Used  Substance Use Topics  . Alcohol use: No  . Drug use: No    Review of Systems  Constitutional: No fever. Eyes: No redness. ENT: No neck pain. Cardiovascular: Denies chest pain. Respiratory: Denies shortness of breath. Gastrointestinal: Positive for nausea. Genitourinary: Positive for flank pain.  Musculoskeletal: Positive for back pain. Skin: Negative for rash. Neurological: Negative for headache.  ____________________________________________   PHYSICAL EXAM:  VITAL SIGNS: ED Triage Vitals  Enc Vitals Group     BP 11/13/17 2001 (!) 151/97     Pulse Rate 11/13/17 2001 93     Resp 11/13/17 2001 20     Temp 11/13/17 2001 99 F (37.2 C)     Temp Source 11/13/17 2001 Oral     SpO2 11/13/17 2001 98 %     Weight 11/13/17 1958 210 lb (95.3 kg)     Height 11/13/17 1958 5\' 8"  (1.727 m)  Head Circumference --      Peak Flow --      Pain Score 11/13/17 1958 10     Pain Loc --      Pain Edu? --      Excl. in [place]? --     Constitutional: Alert and oriented.  Uncomfortable appearing but in no acute distress. Eyes: Conjunctivae are normal.  Head: Atraumatic. Nose: No congestion/rhinnorhea. Mouth/Throat: Mucous membranes are moist.   Neck: Normal range of motion.  Cardiovascular: Good peripheral circulation. Respiratory: Normal respiratory effort. Gastrointestinal: Soft and nontender. No distention.  Genitourinary: Mild right CVA  tenderness. Musculoskeletal: Extremities warm and well perfused.  Neurologic:  Normal speech and language. No gross focal neurologic deficits are appreciated.  Skin:  Skin is warm and dry. No rash noted. Psychiatric: Mood and affect are normal. Speech and behavior are normal.  ____________________________________________   LABS (all labs ordered are listed, but only abnormal results are displayed)  Labs Reviewed  URINALYSIS, COMPLETE (UACMP) WITH MICROSCOPIC - Abnormal; Notable for the following components:      Result Value   Color, Urine STRAW (*)    APPearance CLEAR (*)    Hgb urine dipstick MODERATE (*)    All other components within normal limits  CBC - Abnormal; Notable for the following components:   WBC 15.1 (*)    All other components within normal limits  BASIC METABOLIC PANEL   ____________________________________________  EKG   ____________________________________________  RADIOLOGY  Abd XR: No acute findings  ____________________________________________   PROCEDURES  Procedure(s) performed: No  Procedures  Critical Care performed: No ____________________________________________   INITIAL IMPRESSION / ASSESSMENT AND PLAN / ED COURSE  Pertinent labs & imaging results that were available during my care of the patient were reviewed by me and considered in my medical decision making (see chart for details).  37 year old male with PMH as noted above presents with acute onset of left flank pain over the last 2 days, feeling somewhat similar to a prior kidney stone he most recently had several years ago.  He states that he required a stent at that time.  On exam, the patient is uncomfortable but otherwise well-appearing, the vital signs are normal except for hypertension, and the remainder of the exam is as described above.  Overall presentation is most consistent with ureteral stone.  We will obtain labs and UA, give fluids and analgesia, and reassess.  If  the patient's pain is well controlled and UA is consistent with ureteral stone, there likely will not be indication for CT.  ----------------------------------------- 10:21 PM on 11/13/2017 -----------------------------------------  The patient reports significant improvement in his pain.  I offered to observe the patient for another 1 to 2 hours so that if his pain recurred we could treated and potentially get imaging if needed, however the likelihood of a large obstructing stone given the well-controlled pain and the negative KUB x-ray is very low.  The patient, however declines further observation and states that he would prefer to go home now.  I gave him thorough return precautions, and he expresses understanding.  I provided urology follow-up and the patient agrees with this plan.   ____________________________________________   FINAL CLINICAL IMPRESSION(S) / ED DIAGNOSES  Final diagnoses:  Ureteral colic      NEW MEDICATIONS STARTED DURING THIS VISIT:  New Prescriptions   IBUPROFEN (ADVIL,MOTRIN) 600 MG TABLET    Take 1 tablet (600 mg total) by mouth every 6 (six) hours as needed for moderate pain.  OXYCODONE-ACETAMINOPHEN (PERCOCET) 5-325 MG TABLET    Take 1 tablet by mouth every 6 (six) hours as needed for up to 5 days for severe pain.   TAMSULOSIN (FLOMAX) 0.4 MG CAPS CAPSULE    Take 1 capsule (0.4 mg total) by mouth daily after supper for 10 days.     Note:  This document was prepared using Dragon voice recognition software and may include unintentional dictation errors.    Arta Silence, MD 11/13/17 2223

## 2017-11-13 NOTE — Discharge Instructions (Signed)
Make an appointment to follow-up with the urologist.  Return to the ER for new, worsening, persistent severe pain, pain not controlled by the medications prescribed, vomiting, fevers, weakness, or any other new or worsening symptoms that concern you.

## 2019-10-30 ENCOUNTER — Other Ambulatory Visit: Payer: Self-pay

## 2019-10-30 DIAGNOSIS — Z5321 Procedure and treatment not carried out due to patient leaving prior to being seen by health care provider: Secondary | ICD-10-CM | POA: Insufficient documentation

## 2019-10-30 DIAGNOSIS — R110 Nausea: Secondary | ICD-10-CM | POA: Insufficient documentation

## 2019-10-30 DIAGNOSIS — E860 Dehydration: Secondary | ICD-10-CM | POA: Insufficient documentation

## 2019-10-30 LAB — URINALYSIS, COMPLETE (UACMP) WITH MICROSCOPIC
Bacteria, UA: NONE SEEN
Bilirubin Urine: NEGATIVE
Glucose, UA: NEGATIVE mg/dL
Hgb urine dipstick: NEGATIVE
Ketones, ur: NEGATIVE mg/dL
Leukocytes,Ua: NEGATIVE
Nitrite: NEGATIVE
Protein, ur: NEGATIVE mg/dL
Specific Gravity, Urine: 1.025 (ref 1.005–1.030)
pH: 7 (ref 5.0–8.0)

## 2019-10-30 LAB — CBC
HCT: 44.2 % (ref 39.0–52.0)
Hemoglobin: 15.4 g/dL (ref 13.0–17.0)
MCH: 31.7 pg (ref 26.0–34.0)
MCHC: 34.8 g/dL (ref 30.0–36.0)
MCV: 90.9 fL (ref 80.0–100.0)
Platelets: 271 10*3/uL (ref 150–400)
RBC: 4.86 MIL/uL (ref 4.22–5.81)
RDW: 13.2 % (ref 11.5–15.5)
WBC: 10.8 10*3/uL — ABNORMAL HIGH (ref 4.0–10.5)
nRBC: 0 % (ref 0.0–0.2)

## 2019-10-30 LAB — BASIC METABOLIC PANEL
Anion gap: 12 (ref 5–15)
BUN: 29 mg/dL — ABNORMAL HIGH (ref 6–20)
CO2: 27 mmol/L (ref 22–32)
Calcium: 9.4 mg/dL (ref 8.9–10.3)
Chloride: 97 mmol/L — ABNORMAL LOW (ref 98–111)
Creatinine, Ser: 0.94 mg/dL (ref 0.61–1.24)
GFR calc Af Amer: >60 mL/min (ref >60)
GFR calc non Af Amer: >60 mL/min (ref >60)
Glucose, Bld: 95 mg/dL (ref 70–99)
Potassium: 4 mmol/L (ref 3.5–5.1)
Sodium: 136 mmol/L (ref 135–145)

## 2019-10-30 NOTE — ED Notes (Signed)
Called, no answer. Not seen in lobby or outside.

## 2019-10-30 NOTE — ED Triage Notes (Signed)
Pt arrives to ED via POV from home with c/o dehydration x2 days. Pt reports muscle cramps and dark urine; denies any c/o CP or SHOB. Pt reports (+) nausea, but denies V/D. Pt reports recent travel from [place] and works indoors, but is "not used to the heat here". Pt is A&O, in NAD; RR even, regular, and unlabored.

## 2019-10-31 ENCOUNTER — Emergency Department
Admission: EM | Admit: 2019-10-31 | Discharge: 2019-10-31 | Disposition: A | Discharge status: Home / Self Care | Payer: Self-pay | Attending: Emergency Medicine | Admitting: Emergency Medicine

## 2019-10-31 NOTE — ED Notes (Signed)
Called, no answer. Not seen in lobby, bathroom, or outside

## 2019-10-31 NOTE — ED Notes (Signed)
Called, no answer. Patient not seen in lobby, bathroom, or outside.  

## 2020-08-14 ENCOUNTER — Other Ambulatory Visit: Payer: Self-pay

## 2020-08-14 ENCOUNTER — Encounter: Payer: Self-pay | Admitting: Emergency Medicine

## 2020-08-14 ENCOUNTER — Emergency Department
Admission: EM | Admit: 2020-08-14 | Discharge: 2020-08-14 | Disposition: A | Discharge status: Home / Self Care | Payer: BC Managed Care – PPO | Attending: Emergency Medicine | Admitting: Emergency Medicine

## 2020-08-14 DIAGNOSIS — Z87891 Personal history of nicotine dependence: Secondary | ICD-10-CM | POA: Diagnosis not present

## 2020-08-14 DIAGNOSIS — D170 Benign lipomatous neoplasm of skin and subcutaneous tissue of head, face and neck: Secondary | ICD-10-CM | POA: Insufficient documentation

## 2020-08-14 DIAGNOSIS — L03221 Cellulitis of neck: Secondary | ICD-10-CM | POA: Insufficient documentation

## 2020-08-14 DIAGNOSIS — R221 Localized swelling, mass and lump, neck: Secondary | ICD-10-CM | POA: Diagnosis present

## 2020-08-14 MED ORDER — NAPROXEN 500 MG PO TABS
500.0000 mg | ORAL_TABLET | Freq: Once | ORAL | Status: AC
  Administered 2020-08-14: 500 mg via ORAL
  Filled 2020-08-14: qty 1

## 2020-08-14 MED ORDER — CEPHALEXIN 500 MG PO CAPS: 500.0000 mg | ORAL_CAPSULE | Freq: Two times a day (BID) | ORAL | 0 refills | Status: AC

## 2020-08-14 MED ORDER — NAPROXEN 500 MG PO TABS: 500.0000 mg | ORAL_TABLET | Freq: Two times a day (BID) | ORAL | 2 refills | Status: AC

## 2020-08-14 MED ORDER — CEPHALEXIN 500 MG PO CAPS
500.0000 mg | ORAL_CAPSULE | Freq: Once | ORAL | Status: AC
  Administered 2020-08-14: 500 mg via ORAL
  Filled 2020-08-14: qty 1

## 2020-08-14 NOTE — Discharge Instructions (Signed)
The mass in your neck is a lipoma and needs to be removed surgically. You will not be able to have it removed at home. Please do not cut the skin over it as it may cause an infection. Call the surgeon's office for an appointment.

## 2020-08-14 NOTE — ED Triage Notes (Addendum)
Patient ambulatory to triage with steady gait, without difficulty or distress noted; pt reports cyst to back of neck for several years; st he "you-tubed a video & tried to cut it himself without relief"

## 2020-08-14 NOTE — ED Provider Notes (Signed)
[facility] Emergency Department Provider Note  ____________________________________________  Time seen: Approximately 6:44 AM  I have reviewed the triage vital signs and the nursing notes.   HISTORY  Chief Complaint Abscess   HPI Eric Carrillo is a 40 y.o. male who presents for evaluation of a neck mass.  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  Had a CT scan done in [place] 2 years ago and was told by the doctor that it was a lipoma.  Over the last several years it has been slowly growing.  Over the last month patient has started to have pain at the site of the lipoma.  He reports that he watched a YouTube video to see how to get rid of them and cut over the lipoma to try to get it to drain.   Past Medical History:  Diagnosis Date  . Problems with hearing   . Renal disorder     There are no problems to display for this patient.   Past Surgical History:  Procedure Laterality Date  . ADENOIDECTOMY    . NASAL SINUS SURGERY    . TONSILLECTOMY      Prior to Admission medications   Medication Sig Start Date End Date Taking? Authorizing Provider  cephALEXin (KEFLEX) 500 MG capsule Take 1 capsule (500 mg total) by mouth 2 (two) times daily for 7 days. 08/14/20 08/21/20 Yes Alfred Levins, [place], MD  naproxen (NAPROSYN) 500 MG tablet Take 1 tablet (500 mg total) by mouth 2 (two) times daily with a meal. 08/14/20 08/14/21 Yes Faron Whitelock, [place], MD  ciprofloxacin-hydrocortisone (CIPRO [facility]) otic suspension Place 4 drops into both ears 2 (two) times daily. 12/18/13   Billy Fischer, MD  HYDROcodone-acetaminophen (NORCO/VICODIN) 5-325 MG per tablet Take 2 tablets by mouth every 4 (four) hours as needed. 11/29/13   Noemi Chapel, MD  ibuprofen (ADVIL,MOTRIN) 600 MG tablet Take 1 tablet (600 mg total) by mouth every 6 (six) hours as needed for moderate pain. 11/13/17   Arta Silence, MD    Allergies Levofloxacin and Tramadol  No family  history on file.  Social History Social History   Tobacco Use  . Smoking status: Former Research scientist (life sciences)  . Smokeless tobacco: Never Used  Vaping Use  . Vaping Use: Never used  Substance Use Topics  . Alcohol use: No  . Drug use: No    Review of Systems  Constitutional: Negative for fever. Eyes: Negative for visual changes. ENT: Negative for sore throat. Neck: + neck pain  Cardiovascular: Negative for chest pain. Respiratory: Negative for shortness of breath. Gastrointestinal: Negative for abdominal pain, vomiting or diarrhea. Genitourinary: Negative for dysuria. Musculoskeletal: Negative for back pain. Skin: Negative for rash. Neurological: Negative for headaches, weakness or numbness. Psych: No SI or HI  ____________________________________________   PHYSICAL EXAM:  VITAL SIGNS: ED Triage Vitals  Enc Vitals Group     BP 08/14/20 0632 (!) 164/98     Pulse Rate 08/14/20 0632 87     Resp 08/14/20 0632 20     Temp 08/14/20 0632 98.1 F (36.7 C)     Temp Source 08/14/20 0632 Oral     SpO2 08/14/20 0632 97 %     Weight 08/14/20 0630 250 lb (113.4 kg)     Height 08/14/20 0630 5\' 8"  (1.727 m)     Head Circumference --      Peak Flow --      Pain Score 08/14/20 0630 10  Pain Loc --      Pain Edu? --      Excl. in [place]? --     Constitutional: Alert and oriented. Well appearing and in no apparent distress. HEENT:      Head: Normocephalic and atraumatic.         Eyes: Conjunctivae are normal. Sclera is non-icteric.       Mouth/Throat: Mucous membranes are moist.       Neck: Patient has a lipoma on the R back of his neck now with a 1cm overlying incision and surrounding erythema and warmth Cardiovascular: Regular rate and rhythm.  Respiratory: Normal respiratory effort.  Musculoskeletal:  No edema, cyanosis, or erythema of extremities. Neurologic: Normal speech and language. Face is symmetric. Moving all extremities. No gross focal neurologic deficits are  appreciated. Skin: Skin is warm, dry and intact. No rash noted. Psychiatric: Mood and affect are normal. Speech and behavior are normal.  ____________________________________________   LABS (all labs ordered are listed, but only abnormal results are displayed)  Labs Reviewed - No data to display ____________________________________________  EKG  none  ____________________________________________  RADIOLOGY  none  ____________________________________________   PROCEDURES  Procedure(s) performed: None Procedures Critical Care performed:  None ____________________________________________   INITIAL IMPRESSION / ASSESSMENT AND PLAN / ED COURSE   40 y.o. male who presents for evaluation of a neck mass.  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  Unfortunately patient decided to attempt to remove the lipoma at home and now has overlying cellulitis.  I did explain to the patient that lipomas need to be surgically removed.  I am referring him to a general surgeon.  I have also started him on Keflex for cellulitis and patient given a prescription for naproxen for pain.  No systemic symptoms of more severe infection.  Discussed standard return precautions and follow-up.  Unfortunately records from [place] are not available.       _____________________________________________ Please note:  Patient was evaluated in Emergency Department today for the symptoms described in the history of present illness. Patient was evaluated in the context of the global COVID-19 pandemic, which necessitated consideration that the patient might be at risk for infection with the SARS-CoV-2 virus that causes COVID-19. Institutional protocols and algorithms that pertain to the evaluation of patients at risk for COVID-19 are in a state of rapid change based on information released by regulatory bodies including the CDC and federal and state organizations. These policies and algorithms were  followed during the patient's care in the ED.  Some ED evaluations and interventions may be delayed as a result of limited staffing during the pandemic.   [place] Controlled Substance Database was reviewed by me. ____________________________________________   FINAL CLINICAL IMPRESSION(S) / ED DIAGNOSES   Final diagnoses:  Lipoma of neck  Cellulitis of neck      NEW MEDICATIONS STARTED DURING THIS VISIT:  ED Discharge Orders         Ordered    naproxen (NAPROSYN) 500 MG tablet  2 times daily with meals        08/14/20 0642    cephALEXin (KEFLEX) 500 MG capsule  2 times daily        08/14/20 6503           Note:  This document was prepared using Dragon voice recognition software and may include unintentional dictation errors.    Rudene Re, MD 08/14/20 [phone]

## 2020-11-14 ENCOUNTER — Emergency Department
Admission: EM | Admit: 2020-11-14 | Discharge: 2020-11-14 | Disposition: A | Discharge status: Home / Self Care | Payer: BC Managed Care – PPO | Attending: Emergency Medicine | Admitting: Emergency Medicine

## 2020-11-14 ENCOUNTER — Emergency Department: Payer: BC Managed Care – PPO

## 2020-11-14 ENCOUNTER — Encounter: Payer: Self-pay | Admitting: Emergency Medicine

## 2020-11-14 ENCOUNTER — Other Ambulatory Visit: Payer: Self-pay

## 2020-11-14 DIAGNOSIS — X58XXXA Exposure to other specified factors, initial encounter: Secondary | ICD-10-CM | POA: Diagnosis not present

## 2020-11-14 DIAGNOSIS — S76112A Strain of left quadriceps muscle, fascia and tendon, initial encounter: Secondary | ICD-10-CM | POA: Insufficient documentation

## 2020-11-14 DIAGNOSIS — Y9379 Activity, other specified sports and athletics: Secondary | ICD-10-CM | POA: Diagnosis not present

## 2020-11-14 DIAGNOSIS — F17210 Nicotine dependence, cigarettes, uncomplicated: Secondary | ICD-10-CM | POA: Diagnosis not present

## 2020-11-14 DIAGNOSIS — S76102A Unspecified injury of left quadriceps muscle, fascia and tendon, initial encounter: Secondary | ICD-10-CM | POA: Diagnosis present

## 2020-11-14 MED ORDER — MELOXICAM 15 MG PO TABS: 15.0000 mg | ORAL_TABLET | Freq: Every day | ORAL | 0 refills | Status: AC

## 2020-11-14 MED ORDER — MELOXICAM 7.5 MG PO TABS
15.0000 mg | ORAL_TABLET | Freq: Once | ORAL | Status: AC
  Administered 2020-11-14: 15 mg via ORAL
  Filled 2020-11-14: qty 2

## 2020-11-14 NOTE — ED Triage Notes (Signed)
Pt in via POV, reports diving accident while playing kickball yesterday, hearing popping sound, continues today with severe pain to left knee, difficulty walking due to pain.  NAD noted.

## 2020-11-14 NOTE — ED Provider Notes (Signed)
[facility] Emergency Department Provider Note  ____________________________________________  Time seen: Approximately 11:02 PM  I have reviewed the triage vital signs and the nursing notes.   HISTORY  Chief Complaint Knee Pain    HPI Eric Carrillo is a 40 y.o. male who presents to the emergency department complaining of left knee pain.  Patient states he was playing kickball yesterday, and dove into home plate and landed on his knees.  Patient states that he felt a popping sensation of the left knee.  He was able to get up, walk around yesterday though he did have some pain.  Pain is increased today and he describes it as a burning sensation running from his knee Into his quad region.  No loss of range of motion.  Patient denies any gross swelling.  He did sustain abrasions to both knees but thoroughly washed them out after this occurred.  He denies any erythema.  No other injury or complaint at this time.       Past Medical History:  Diagnosis Date   Problems with hearing    Renal disorder     There are no problems to display for this patient.   Past Surgical History:  Procedure Laterality Date   ADENOIDECTOMY     NASAL SINUS SURGERY     TONSILLECTOMY      Prior to Admission medications   Medication Sig Start Date End Date Taking? Authorizing Provider  meloxicam (MOBIC) 15 MG tablet Take 1 tablet (15 mg total) by mouth daily. 11/14/20  Yes Sayer Masini, Charline Bills, PA-C  ciprofloxacin-hydrocortisone (CIPRO HC) otic suspension Place 4 drops into both ears 2 (two) times daily. 12/18/13   Billy Fischer, MD  HYDROcodone-acetaminophen (NORCO/VICODIN) 5-325 MG per tablet Take 2 tablets by mouth every 4 (four) hours as needed. 11/29/13   Noemi Chapel, MD  ibuprofen (ADVIL,MOTRIN) 600 MG tablet Take 1 tablet (600 mg total) by mouth every 6 (six) hours as needed for moderate pain. 11/13/17   Arta Silence, MD  naproxen (NAPROSYN) 500 MG tablet Take 1 tablet  (500 mg total) by mouth 2 (two) times daily with a meal. 08/14/20 08/14/21  Alfred Levins, [place], MD    Allergies Levofloxacin and Tramadol  No family history on file.  Social History Social History   Tobacco Use   Smoking status: Every Day    Packs/day: 0.50    Types: Cigarettes   Smokeless tobacco: Never  Vaping Use   Vaping Use: Never used  Substance Use Topics   Alcohol use: No   Drug use: No     Review of Systems  Constitutional: No fever/chills Eyes: No visual changes. No discharge ENT: No upper respiratory complaints. Cardiovascular: no chest pain. Respiratory: no cough. No SOB. Gastrointestinal: No abdominal pain.  No nausea, no vomiting.  No diarrhea.  No constipation. Musculoskeletal: Left knee pain/injury Skin: Negative for rash, abrasions, lacerations, ecchymosis. Neurological: Negative for headaches, focal weakness or numbness.  10 System ROS otherwise negative.  ____________________________________________   PHYSICAL EXAM:  VITAL SIGNS: ED Triage Vitals  Enc Vitals Group     BP 11/14/20 2200 (!) 137/92     Pulse Rate 11/14/20 2200 97     Resp 11/14/20 2200 18     Temp 11/14/20 2200 98.6 F (37 C)     Temp Source 11/14/20 2200 Oral     SpO2 11/14/20 2200 96 %     Weight 11/14/20 2201 250 lb (113.4 kg)     Height 11/14/20  2201 5\' 8"  (1.727 m)     Head Circumference --      Peak Flow --      Pain Score 11/14/20 2209 9     Pain Loc --      Pain Edu? --      Excl. in [place]? --      Constitutional: Alert and oriented. Well appearing and in no acute distress. Eyes: Conjunctivae are normal. PERRL. EOMI. Head: Atraumatic. ENT:      Ears:       Nose: No congestion/rhinnorhea.      Mouth/Throat: Mucous membranes are moist.  Neck: No stridor.    Cardiovascular: Normal rate, regular rhythm. Normal S1 and S2.  Good peripheral circulation. Respiratory: Normal respiratory effort without tachypnea or retractions. Lungs CTAB. Good air entry to the bases  with no decreased or absent breath sounds. Musculoskeletal: Full range of motion to all extremities. No gross deformities appreciated.  Visualization of the left knee reveals a superficial abrasion to the left knee.  No foreign body.  No surrounding erythema or edema.  No drainage.  Patient has no gross edema or erythema of the left knee when compared with right.  Diffusely tender to palpation along the inferior quad and quadriceps tendon.  Patient with no palpable deficits.  Good flexion and extension of the knee at this time.  Other than quadriceps, quad tendon into the patella there is no other tenderness.  No ballottement.  Special tests of the knee to include Lachman's, varus, valgus, McMurray's is negative.  Dorsalis pedis pulses sensation intact distally. Neurologic:  Normal speech and language. No gross focal neurologic deficits are appreciated.  Skin:  Skin is warm, dry and intact. No rash noted. Psychiatric: Mood and affect are normal. Speech and behavior are normal. Patient exhibits appropriate insight and judgement.   ____________________________________________   LABS (all labs ordered are listed, but only abnormal results are displayed)  Labs Reviewed - No data to display ____________________________________________  EKG   ____________________________________________  RADIOLOGY I personally viewed and evaluated these images as part of my medical decision making, as well as reviewing the written report by the radiologist.  ED Provider Interpretation: No acute fracture, dislocation or joint effusion on x-ray  DG Knee Complete 4 Views Left  Result Date: 11/14/2020 CLINICAL DATA:  Pt in via POV, reports diving accident while playing kickball yesterday, hearing popping sound, continues today with severe pain to left knee, difficulty walking due to pain. EXAM: LEFT KNEE - COMPLETE 4+ VIEW COMPARISON:  None. FINDINGS: No evidence of fracture, dislocation, or joint effusion. No  evidence of arthropathy or other focal bone abnormality. Soft tissues are unremarkable. IMPRESSION: Negative. Electronically Signed   By: Iven Finn M.D.   On: 11/14/2020 22:40    ____________________________________________    PROCEDURES  Procedure(s) performed:    Procedures    Medications  meloxicam (MOBIC) tablet 15 mg (15 mg Oral Given 11/14/20 2321)     ____________________________________________   INITIAL IMPRESSION / ASSESSMENT AND PLAN / ED COURSE  Pertinent labs & imaging results that were available during my care of the patient were reviewed by me and considered in my medical decision making (see chart for details).  Review of the [place] CSRS was performed in accordance of the [place] prior to dispensing any controlled drugs.           Patient's diagnosis is consistent with strain of left quadriceps.  Patient presented to the emergency department with left knee pain.  He  was diving for playing kickball when he landed directly on his knees.  Patient had pain along his quadriceps and quadriceps tendon.  No deficits on exam.  No evidence of joint effusion or rupture on x-ray.  Exam was overall reassuring with special testing negative.  Findings were concerning for quadricep strain the patient was placed on anti-inflammatory and given hinged knee brace.  Follow-up with orthopedics as needed..  Patient is given ED precautions to return to the ED for any worsening or new symptoms.     ____________________________________________  FINAL CLINICAL IMPRESSION(S) / ED DIAGNOSES  Final diagnoses:  Strain of left quadriceps, initial encounter      NEW MEDICATIONS STARTED DURING THIS VISIT:  ED Discharge Orders          Ordered    meloxicam (MOBIC) 15 MG tablet  Daily        11/14/20 2304                This chart was dictated using voice recognition software/Dragon. Despite best efforts to proofread, errors can occur which can change the meaning. Any  change was purely unintentional.    Darletta Moll, PA-C 11/14/20 2343    Vanessa [place], MD 11/15/20 [phone]

## 2022-10-15 IMAGING — DX DG KNEE COMPLETE 4+V*L*
4 series · 4 of 4 positions shown · non-contrast
Comparison: None.

CLINICAL DATA: Pt in via POV, reports diving accident while playing
kickball yesterday, hearing popping sound, continues today with
severe pain to left knee, difficulty walking due to pain.

EXAM:
LEFT KNEE - COMPLETE 4+ VIEW

[knee ap]
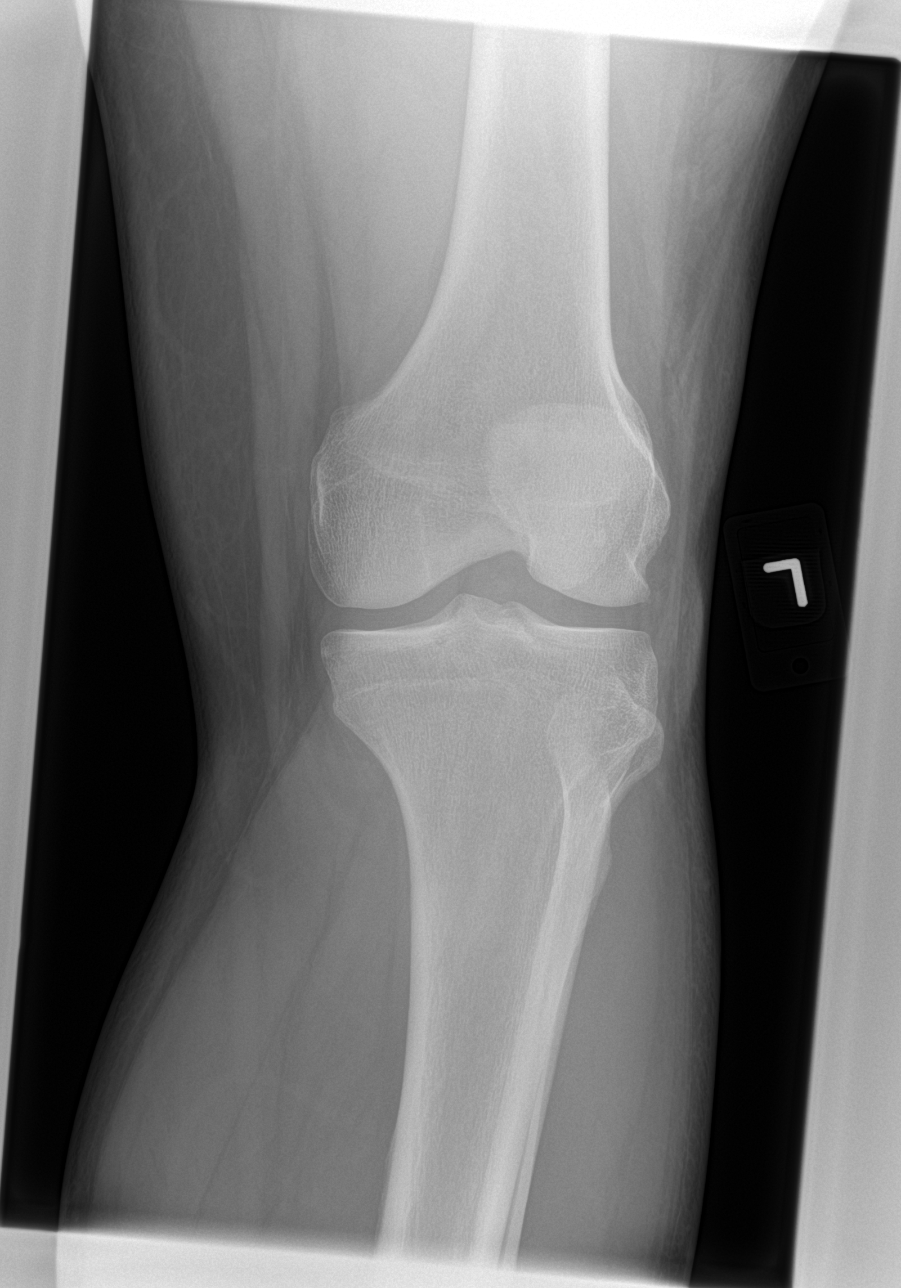

[knee lat]
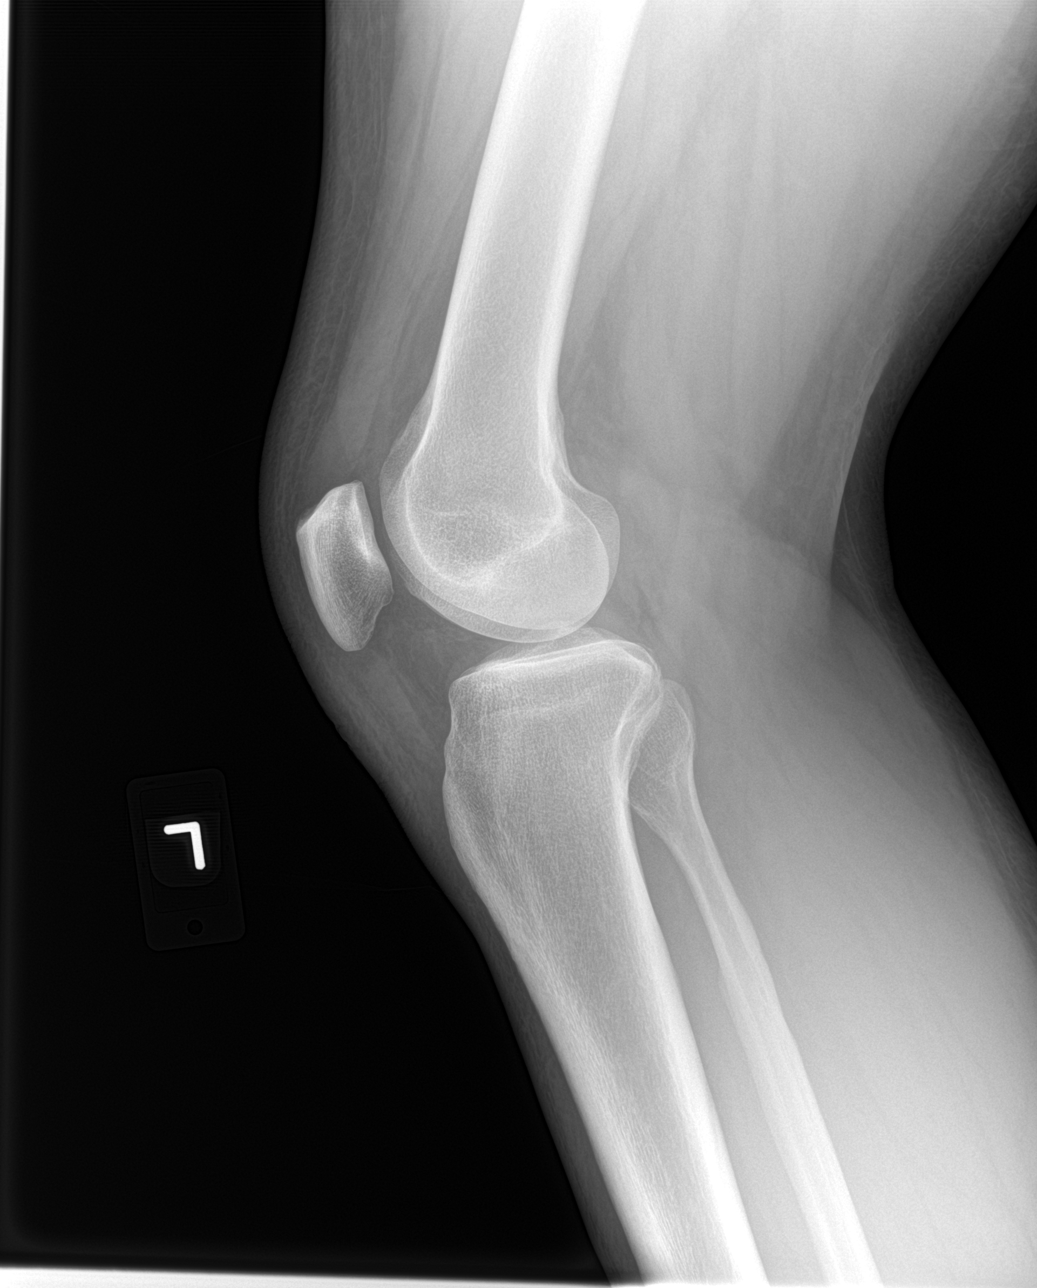

[knee obl (1 of 2)]
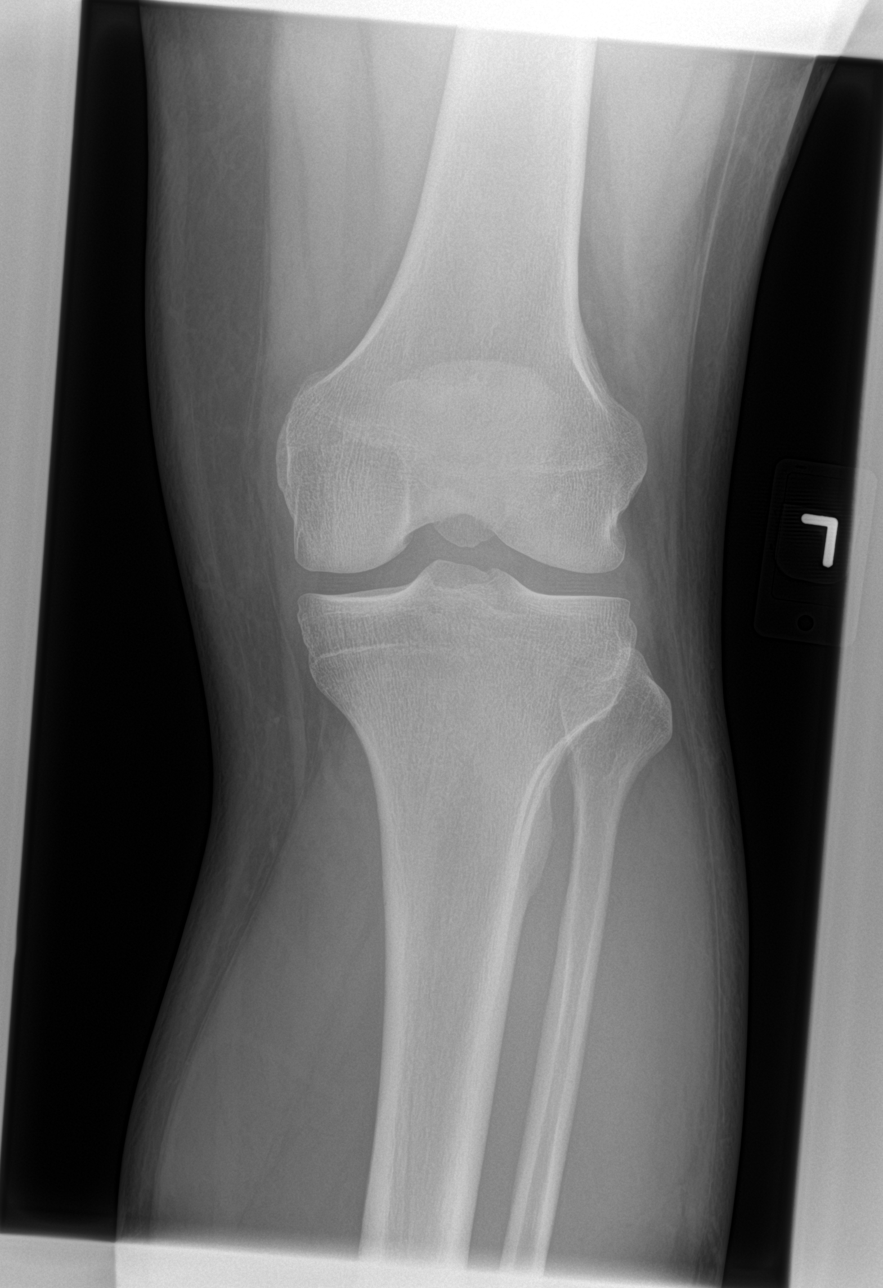

[knee obl (2 of 2)]
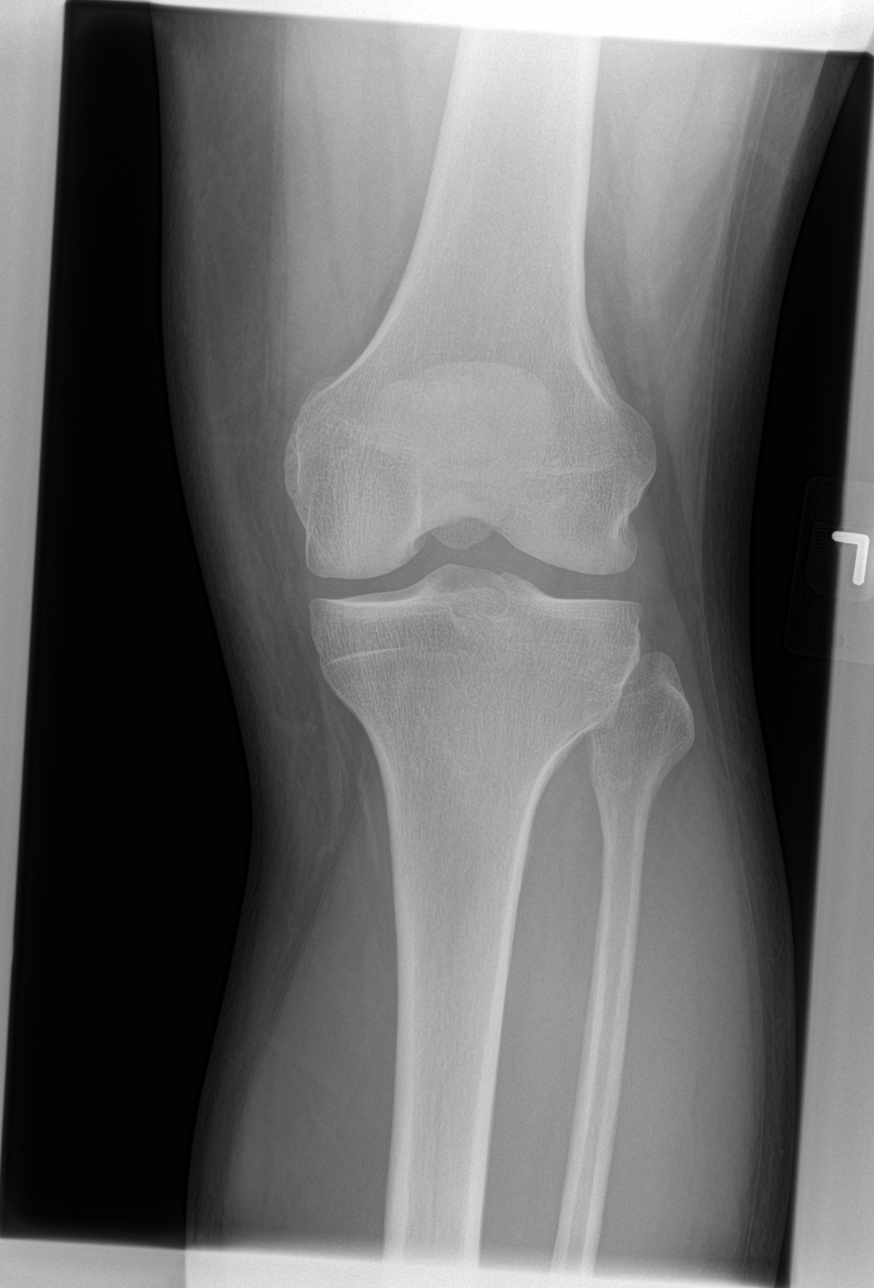

[4 of 4 positions shown; findings below may reference images not displayed]

FINDINGS: No evidence of fracture, dislocation, or joint effusion. No evidence
of arthropathy or other focal bone abnormality. Soft tissues are
unremarkable.
IMPRESSION: Negative.
